# Patient Record
Sex: Female | Born: 1990 | Marital: Married | Smoking: Never smoker
Health system: Southern US, Community
[De-identification: ages and names within clinical notes are randomized; demographics above are authoritative.]

---

## 2014-09-05 ENCOUNTER — Emergency Department (HOSPITAL_COMMUNITY)
Admission: EM | Admit: 2014-09-05 | Discharge: 2014-09-05 | Disposition: A | Discharge status: Home / Self Care | Payer: Medicaid - Out of State | Attending: Emergency Medicine | Admitting: Emergency Medicine

## 2014-09-05 ENCOUNTER — Emergency Department (HOSPITAL_COMMUNITY): Payer: Medicaid - Out of State

## 2014-09-05 ENCOUNTER — Encounter (HOSPITAL_COMMUNITY): Payer: Self-pay | Admitting: *Deleted

## 2014-09-05 DIAGNOSIS — Y9289 Other specified places as the place of occurrence of the external cause: Secondary | ICD-10-CM | POA: Insufficient documentation

## 2014-09-05 DIAGNOSIS — X58XXXA Exposure to other specified factors, initial encounter: Secondary | ICD-10-CM | POA: Insufficient documentation

## 2014-09-05 DIAGNOSIS — Y939 Activity, unspecified: Secondary | ICD-10-CM | POA: Insufficient documentation

## 2014-09-05 DIAGNOSIS — T7840XA Allergy, unspecified, initial encounter: Secondary | ICD-10-CM | POA: Insufficient documentation

## 2014-09-05 DIAGNOSIS — Y998 Other external cause status: Secondary | ICD-10-CM | POA: Diagnosis not present

## 2014-09-05 DIAGNOSIS — R0602 Shortness of breath: Secondary | ICD-10-CM

## 2014-09-05 NOTE — ED Notes (Addendum)
Pt took Benadryl PTA.  All lung sounds are clear but pt reports her breathing feels labored.  Pt placed on 2L O2 for comfort.

## 2014-09-05 NOTE — ED Notes (Signed)
Pt ambulated down hallway and around nurses station.  Pt pulse Ox remained above 92%.  Pt denies any dizziness or SHOB.

## 2014-09-05 NOTE — Discharge Instructions (Signed)
Allergies °Allergies may happen from anything your body is sensitive to. This may be food, medicines, pollens, chemicals, and nearly anything around you in everyday life that produces allergens. An allergen is anything that causes an allergy producing substance. Heredity is often a factor in causing these problems. This means you may have some of the same allergies as your parents. °Food allergies happen in all age groups. Food allergies are some of the most severe and life threatening. Some common food allergies are cow's milk, seafood, eggs, nuts, wheat, and soybeans. °SYMPTOMS  °· Swelling around the mouth. °· An itchy red rash or hives. °· Vomiting or diarrhea. °· Difficulty breathing. °SEVERE ALLERGIC REACTIONS ARE LIFE-THREATENING. °This reaction is called anaphylaxis. It can cause the mouth and throat to swell and cause difficulty with breathing and swallowing. In severe reactions only a trace amount of food (for example, peanut oil in a salad) may cause death within seconds. °Seasonal allergies occur in all age groups. These are seasonal because they usually occur during the same season every year. They may be a reaction to molds, grass pollens, or tree pollens. Other causes of problems are house dust mite allergens, pet dander, and mold spores. The symptoms often consist of nasal congestion, a runny itchy nose associated with sneezing, and tearing itchy eyes. There is often an associated itching of the mouth and ears. The problems happen when you come in contact with pollens and other allergens. Allergens are the particles in the air that the body reacts to with an allergic reaction. This causes you to release allergic antibodies. Through a chain of events, these eventually cause you to release histamine into the blood stream. Although it is meant to be protective to the body, it is this release that causes your discomfort. This is why you were given anti-histamines to feel better.  If you are unable to  pinpoint the offending allergen, it may be determined by skin or blood testing. Allergies cannot be cured but can be controlled with medicine. °Hay fever is a collection of all or some of the seasonal allergy problems. It may often be treated with simple over-the-counter medicine such as diphenhydramine. Take medicine as directed. Do not drink alcohol or drive while taking this medicine. Check with your caregiver or package insert for child dosages. °If these medicines are not effective, there are many new medicines your caregiver can prescribe. Stronger medicine such as nasal spray, eye drops, and corticosteroids may be used if the first things you try do not work well. Other treatments such as immunotherapy or desensitizing injections can be used if all else fails. Follow up with your caregiver if problems continue. These seasonal allergies are usually not life threatening. They are generally more of a nuisance that can often be handled using medicine. °HOME CARE INSTRUCTIONS  °· If unsure what causes a reaction, keep a diary of foods eaten and symptoms that follow. Avoid foods that cause reactions. °· If hives or rash are present: °¨ Take medicine as directed. °¨ You may use an over-the-counter antihistamine (diphenhydramine) for hives and itching as needed. °¨ Apply cold compresses (cloths) to the skin or take baths in cool water. Avoid hot baths or showers. Heat will make a rash and itching worse. °· If you are severely allergic: °¨ Following a treatment for a severe reaction, hospitalization is often required for closer follow-up. °¨ Wear a medic-alert bracelet or necklace stating the allergy. °¨ You and your family must learn how to give adrenaline or use   an anaphylaxis kit.  If you have had a severe reaction, always carry your anaphylaxis kit or EpiPen with you. Use this medicine as directed by your caregiver if a severe reaction is occurring. Failure to do so could have a fatal outcome. SEEK MEDICAL  CARE IF:  You suspect a food allergy. Symptoms generally happen within 30 minutes of eating a food.  Your symptoms have not gone away within 2 days or are getting worse.  You develop new symptoms.  You want to retest yourself or your child with a food or drink you think causes an allergic reaction. Never do this if an anaphylactic reaction to that food or drink has happened before. Only do this under the care of a caregiver. SEEK IMMEDIATE MEDICAL CARE IF:   You have difficulty breathing, are wheezing, or have a tight feeling in your chest or throat.  You have a swollen mouth, or you have hives, swelling, or itching all over your body.  You have had a severe reaction that has responded to your anaphylaxis kit or an EpiPen. These reactions may return when the medicine has worn off. These reactions should be considered life threatening. MAKE SURE YOU:   Understand these instructions.  Will watch your condition.  Will get help right away if you are not doing well or get worse. Document Released: 11/09/2002 Document Revised: 12/11/2012 Document Reviewed: 04/15/2008 [facility] Patient Information 2015 [place], [place]. This information is not intended to replace advice given to you by your health care provider. Make sure you discuss any questions you have with your health care provider.   Emergency Department Resource Guide 1) Find a Doctor and Pay Out of Pocket Although you won't have to find out who is covered by your insurance plan, it is a good idea to ask around and get recommendations. You will then need to call the office and see if the doctor you have chosen will accept you as a new patient and what types of options they offer for patients who are self-pay. Some doctors offer discounts or will set up payment plans for their patients who do not have insurance, but you will need to ask so you aren't surprised when you get to your appointment.  2) Contact Your Local Health  Department Not all health departments have doctors that can see patients for sick visits, but many do, so it is worth a call to see if yours does. If you don't know where your local health department is, you can check in your phone book. The CDC also has a tool to help you locate your state's health department, and many state websites also have listings of all of their local health departments.  3) Find a [facility] If your illness is not likely to be very severe or complicated, you may want to try a walk in clinic. These are popping up all over the country in pharmacies, drugstores, and shopping centers. They're usually staffed by nurse practitioners or physician assistants that have been trained to treat common illnesses and complaints. They're usually fairly quick and inexpensive. However, if you have serious medical issues or chronic medical problems, these are probably not your best option.  No Primary Care Doctor: - Call Health Connect at  [phone] - they can help you locate a primary care doctor that  accepts your insurance, provides certain services, etc. - Physician Referral Service- [phone]  Chronic Pain Problems: Organization         Address  Phone  Notes  [facility]  [phone] Patients need to be referred by their primary care doctor.   Medication Assistance: Organization         Address  Phone   Notes  [facility] Medication [facility] [address] [place], [place] [address] [phone] --Must be a resident of [facility] -- Must have NO insurance coverage whatsoever (no Medicaid/ Medicare, etc.) -- The pt. MUST have a primary care doctor that directs their care regularly and follows them in the community   Med[facility]ist  [phone]   [employer]  [phone]    Agencies that provide inexpensive medical care: Organization         Address  Phone   Notes  [place]  [phone]   [facility]    [phone]   [facility] [place],  [address] [phone]   [place] [address], [place] [phone]   Planned Parenthood    [phone]   [facility]    [phone]   [place] and [place] [address], [place] Phone:  [phone], Fax:  [phone] Hours of Operation:  9 am - 6 pm, M-F.  Also accepts Medicaid/Medicare and self-pay.  [facility] for [place], [address], [place] Phone: [phone], Fax: [phone]. Hours of Operation:  8:30 am - 5:30 pm, M-F.  Also accepts Medicaid and self-pay.  [facility] [address], [place] Phone: [phone]   [place], [place], [place] [phone], Ext. [phone] Mondays & Thursdays: 7-9 AM.  First 15 patients are seen on a first come, first serve basis.    [place] Providers:  Organization         Address  Phone   Notes  [facility] [address], [place] [phone] Also accepts self-pay patients.  [facility] [address], [place]  [phone]   [place], [address], [place] [phone]   [facility] [address] [place] [phone]   Lucianne Lei [address], [place]   [phone] Only accepts [place] Access [place] patients after they have their name applied to their card.   Self-Pay (no insurance) in [facility]:  Organization         Address  Phone   Notes  Sickle Cell Patients, [facility] [place] [phone]   [facility] [place] [phone]   [facility] [place]  [place], [place],  [place] [phone]   [facility]/Dr. Osei-Bonsu  [address], [place] [phone] Phone number for both [place] and [place] locations is the same.  Urgent Medical and [facility] [address] [place] [phone]   [facility] [address] [place] or [address] [phone]   [facility] [address], [place] [phone], phone; [phone], fax Sees patients 1st and 3rd Saturday of every month.  Must not qualify for public or private insurance (  i.e. Medicaid, Medicare, [place] Health Choice, Veterans' Benefits)  Household income should be no more than 200% of the poverty level The clinic cannot treat you if you are pregnant or think you are pregnant  Sexually transmitted diseases are not treated at the clinic.    Dental Care: Organization         Address  Phone  Notes  [facility] Department of [facility] [place] [phone] Accepts children up to age 106 who are enrolled in [place] or [place]; pregnant women with a Medicaid card; and children who have applied for Medicaid or [place] Health Choice, but were declined, whose parents can pay a reduced fee at time of service.  [facility] Department of [facility]  [address], [place] [phone] Accepts children up to age 78 who are enrolled in [place] or [place]; pregnant women with a Medicaid card; and children who have applied for Medicaid or [place] Health Choice, but were declined, whose parents can pay a reduced fee at time of service.  [place] Adult Dental Access PROGRAM  [place] [phone] Patients are seen by appointment only. Walk-ins are not accepted. [place] will see patients 31 years of age and older. Monday - Tuesday (8am-5pm) Most Wednesdays  (8:30-5pm) $30 per visit, cash only  [facility] Adult Dental Access PROGRAM  [address], [facility] [phone] Patients are seen by appointment only. Walk-ins are not accepted. [place] will see patients 82 years of age and older. One Wednesday Evening (Monthly: Volunteer Based).  $30 per visit, cash only  [place]  [phone] for adults; Children under age 29, call Graduate Pediatric Dentistry at [phone]. Children aged 70-14, please call [phone] to request a pediatric application.  Dental services are provided in all areas of dental care including fillings, crowns and bridges, complete and partial dentures, implants, gum treatment, root canals, and extractions. Preventive care is also provided. Treatment is provided to both adults and children. Patients are selected via a lottery and there is often a waiting list.   [facility] [address], [place]  [phone] [email or web address]   [facility] [address] [place], [place] [phone], Ext. [phone] Second and Fourth Thursday of each month, opens at 6:30 AM; Clinic ends at 9 AM.  Patients are seen on a first-come first-served basis, and a limited number are seen during each clinic.   [facility]  [address] Hillard Danker [place], [place] [phone]   Eligibility Requirements You must have lived in [place], [place], or [place] counties for at least the last three months.   You cannot be eligible for state or federal sponsored [employer], including [employer], [place], or [employer].   You generally cannot be eligible for healthcare insurance through your employer.    How to apply: Eligibility screenings are held every Tuesday and Wednesday afternoon from 1:00 pm until 4:00 pm. You do not need an appointment for the interview!  [facility] [address], [place], [place]   [place]  [place] Department  [place]  [phone]    Behavioral Health Resources in the Community: Intensive Outpatient Programs Organization         Address  Phone  Notes  [facility]  [place] [address] [place], [place] [phone]   [facility] Outpatient [address] [place], [place]   ADS: Alcohol & Drug Svcs [address] [place], [place]   [place] [address],  [place], [place] or [phone]   Substance Abuse Resources Organization         Address  Phone  Notes  Alcohol and Drug Services  [phone]   [place]  [phone]   [facility] [place]   Chinita Pester  [phone]   Residential & Outpatient Substance Abuse Program  [phone]   Psychological Services Organization         Address  Phone  Notes  [facility]  [place]  [phone]   [place] [address] [place] or [phone]    Mobile Crisis Teams Organization         Address  Phone  Notes  [facility], Mobile Crisis Care Unit  [phone]   [facility]  [address] [place], [place]   [place] [address], [place] [facility] [place] [phone]    Self-Help/Support Groups Organization         Address  Phone             Notes  [place]. of [place] - variety of support groups  [place] Call for more information  Narcotics Anonymous (NA), [facility] [address], [employer] [place]  2 meetings at this location   Special educational needs teacher         Address  Phone  Notes  [facility]idential Treatment [place],    [place]  [phone]   [facility]  [address] [place] [address], [place], [place]   [facility] [place], [place] [phone] Admissions: 8am-3pm M-F  Incentives Substance [place] [address],    [place], [place] [phone]   [facility] [address] [place], [place], [place]   [facility] [address],  [place], [place]   [facility] - Intensive Outpatient [address] Kristeen [address], [place], [place]   [facility] ([place] [address]  [place], [place] [phone] or [phone]   [facility] Services (RTS) [address] [place], [place] Accepts Medicaid  [facility] [address],  [place] [phone] Substance Abuse/Addiction Treatment   [facility] Organization         Address  Phone  Notes  [facility]  [phone]   Domenic Schwab, PhD [address] Arlis [place], [place]   [phone] or [phone]   [place], [place] [phone]   [facility] [address], [place], [place] [phone] Insurance/Medicaid/sponsorship through [facility] and Families [address] [place]                                    [place], [place] [phone] [place] [address], [place] [phone]    Dr. Adele Schilder  [phone]   [facility] [place] [facility] Dept. 1)  [address],  2) [place] 3)  [place], [place] [phone]  [phone]   [place] [phone] or [phone] (After Hours)

## 2014-09-05 NOTE — ED Provider Notes (Signed)
CSN: [id number]     Arrival date & time 09/05/14  1622 History   First MD Initiated Contact with Patient 09/05/14 1719     Chief Complaint  Patient presents with  . Allergic Reaction     (Consider location/radiation/quality/duration/timing/severity/associated sxs/prior Treatment) HPI   24 year old female who presents for evaluation of an allergic reaction. Patient reports she developed shortness of breath, and her hands and chest at approximately an hour ago while she was unpacking in her new apartment. States that she noticed hives throughout her hands and chest area was itchy. She also reported having shortness of breath during that episode. She did take Benadryl prior to arrival but decided to come to ER because the symptom was not resolving as fast as she anticipated. Patient states she has 2 separate episodes in the past with similar hives and shortness of breath and related to having been bitten by insects. She also reported having a non-itchy rash that comes and goes for the past month. Rash was noted to her arms, chest. She was seen by her PCP with concerns of possible scabies because her roommate has similar rash however it was felt it was not likely to be related to scabies. She was prescribed hydrocortisone cream to use as needed. She was also given a referral to dermatologist which she has not follow up. She denies having any change in her medication, new pets, new soap detergent or other body products. Denies any prior history of PE DVT, recent surgery, prolonged rest, unilateral leg swelling or calf pain, active cancer, or taking birth control pill. She did report being on a flight for approximately 2-3 hours several weeks ago and did report some right leg cramping intermittently at that time but no swelling. At this time she felt that her symptom has resolved, no complaint of throat swelling, lip swelling, abdominal cramping nausea vomiting diarrhea.  No past medical history on file. No  past surgical history on file. No family history on file. History  Substance Use Topics  . Smoking status: Never Smoker   . Smokeless tobacco: Never Used  . Alcohol Use: No   OB History    No data available     Review of Systems  All other systems reviewed and are negative.     Allergies  Review of patient's allergies indicates no known allergies.  Home Medications   Prior to Admission medications   Not on File   BP 112/69 mmHg  Pulse 88  Temp(Src) 98.3 F (36.8 C) (Oral)  Resp 20  Ht  (1.702 m)  Wt 240 lb (108.863 kg)  BMI 37.58 kg/m2  SpO2 100%  LMP 08/25/2014 (Exact Date) Physical Exam  Constitutional: She is oriented to person, place, and time. She appears well-developed and well-nourished. No distress.  HENT:  Head: Atraumatic.  Mouth/Throat: Oropharynx is clear and moist.  No oral mucosa rash. No angioedema of lips.  Eyes: Conjunctivae are normal.  Neck: Neck supple.  Cardiovascular: Normal rate and regular rhythm.   Pulmonary/Chest: Effort normal and breath sounds normal. No respiratory distress. She has no wheezes.  Abdominal: Soft. There is no tenderness.  Neurological: She is alert and oriented to person, place, and time.  Skin: No rash noted.  Psychiatric: She has a normal mood and affect.  Nursing note and vitals reviewed.   ED Course  Procedures (including critical care time)  6:04 PM Patient here for evaluation of a possible allergic reaction to an unknown substance. She was recently moved  to appointment and currently unpacking. At this time she has no evidence of pruritic eyes, or respiratory compromise. She is mentating appropriately. She is technically PERC negative and I have low suspicion for PE. I do not appreciate any rash on exam. She mentioned that she has noticed swelling to her bilateral eyelids daily for the past several months. On exam, minimal edema noted to bilateral upper eyelids. She does not have evidence of peripheral  edema on exam. No signs of infection. Patient will likely benefit from a PCP.    7:15 PM Pt has been monitored without any worsening of sxs.  She's stable for discharge.  Resources provided   Labs Review Labs Reviewed - No data to display  Imaging Review Dg Chest 2 View  09/05/2014   CLINICAL DATA:  Shortness of breath and chest pressure.  EXAM: CHEST - 2 VIEW  COMPARISON:  None  FINDINGS: The heart size and mediastinal contours are within normal limits. Mild bibasilar atelectasis. There is no evidence of pulmonary edema, consolidation, pneumothorax, nodule or pleural fluid. The visualized skeletal structures are unremarkable.  IMPRESSION: No active disease.   Electronically Signed   By: Irish LackGlenn  Yamagata M.D.   On: 09/05/2014 17:00     EKG Interpretation None      MDM   Final diagnoses:  Allergic reaction, initial encounter    BP 116/68 mmHg  Pulse 80  Temp(Src) 98.3 F (36.8 C) (Oral)  Resp 20  Ht 5\' 7"  (1.702 m)  Wt 240 lb (108.863 kg)  BMI 37.58 kg/m2  SpO2 100%  LMP 08/25/2014 (Exact Date)     Fayrene HelperBowie Burdette Forehand, PA-C 09/05/14 1916  Tilden FossaElizabeth Rees, MD 09/06/14 [id number]

## 2014-09-05 NOTE — ED Notes (Addendum)
Pt reports having a allergic reaction to an unknown substance. Pt reports symptoms started 45 minutes ago. Pt states she moved into a new apartment today and was unpacking when symptoms started. Pt took a benadryl when symptoms started. Pt reports difficullty breathing. Pt speaking in full complete sentences, no respiratory distress noted with airway intact. Pt respirations 20 bpm, 100% RA. Pt reports hives on hands and chest. No hives noted. Pt denies taking any new medications, change in soaps, lotions, no new medications.

## 2017-01-04 IMAGING — CR DG CHEST 2V
2 series · 2 of 2 positions shown · non-contrast
Comparison: None

CLINICAL DATA: Shortness of breath and chest pressure.

EXAM:
CHEST - 2 VIEW

[chest pa]
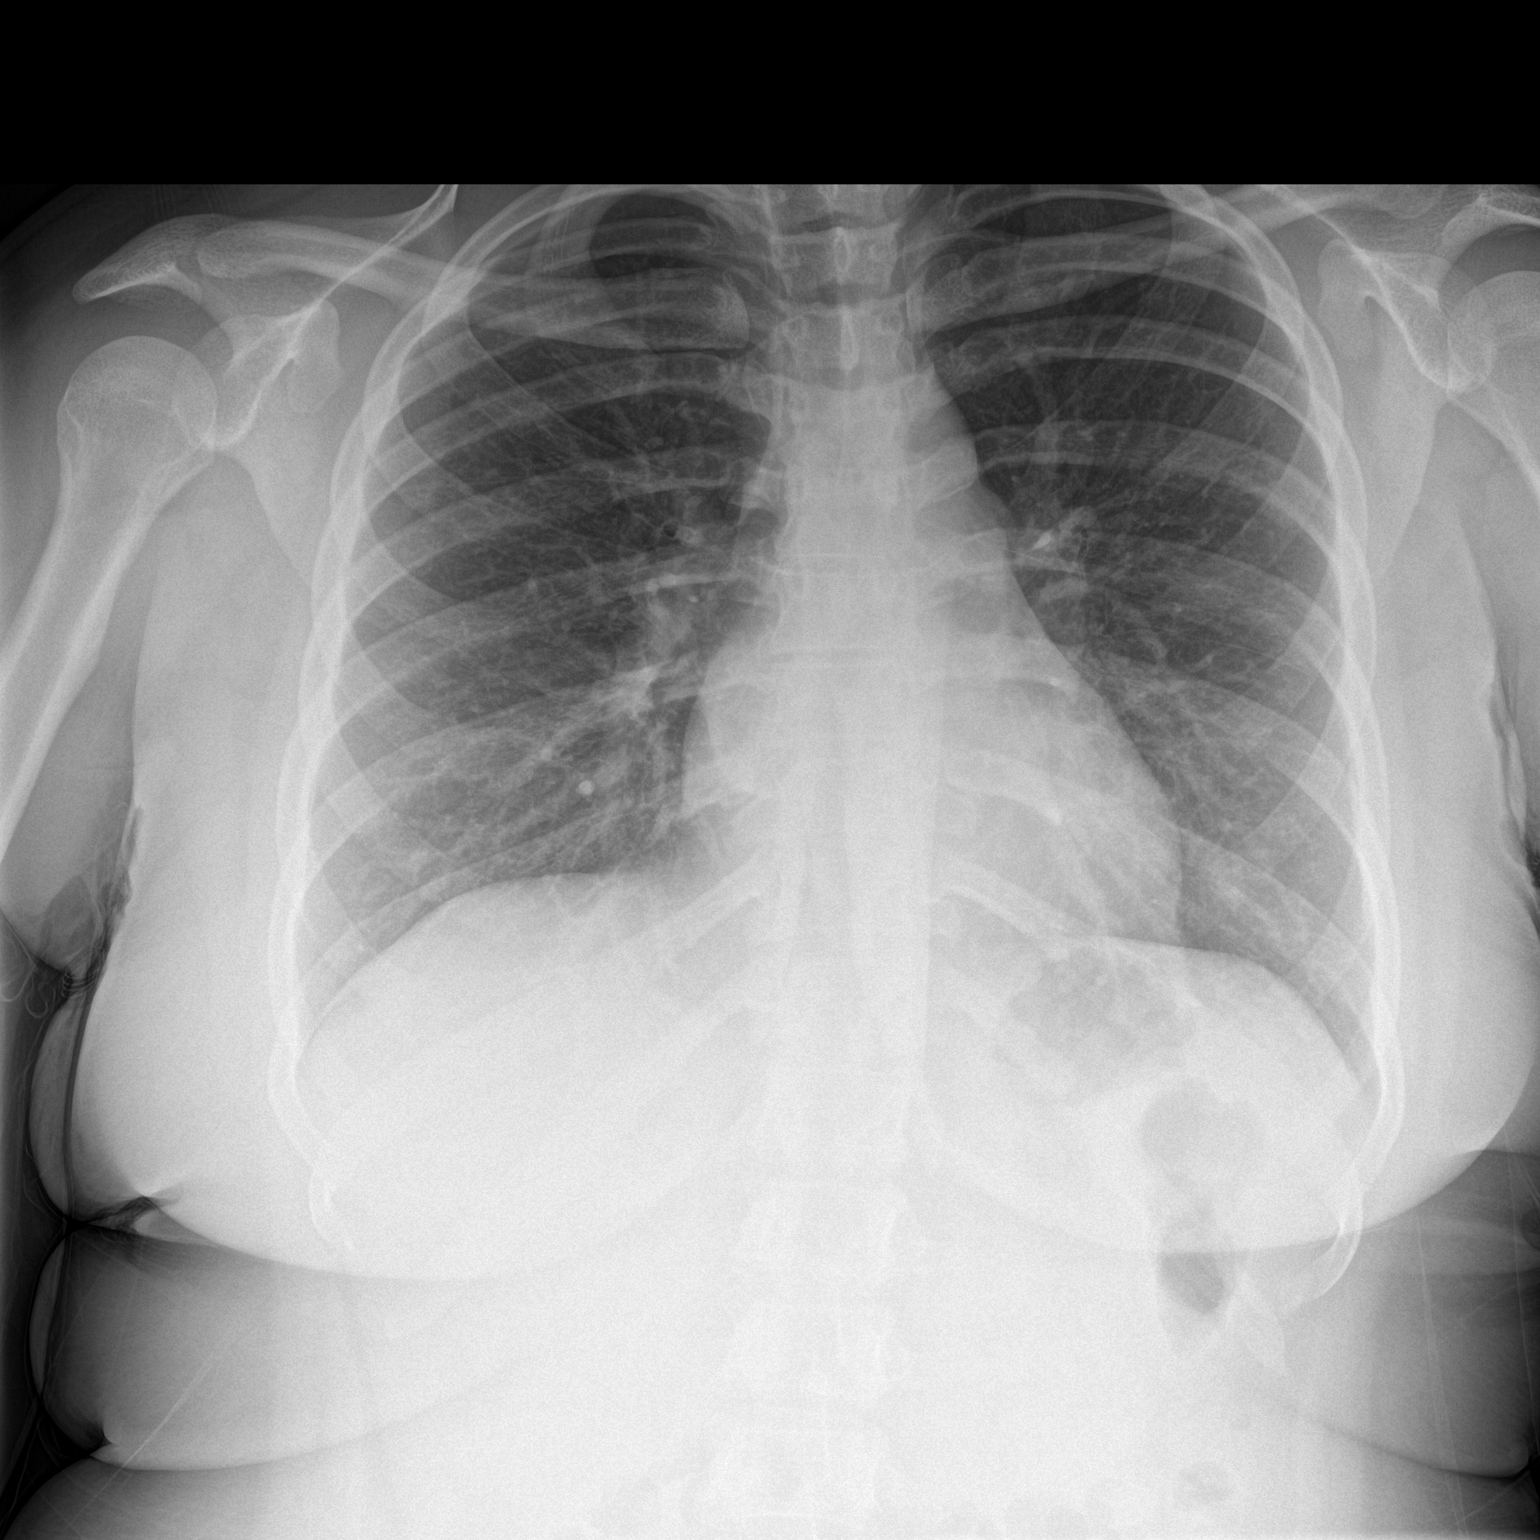

[chest lat]
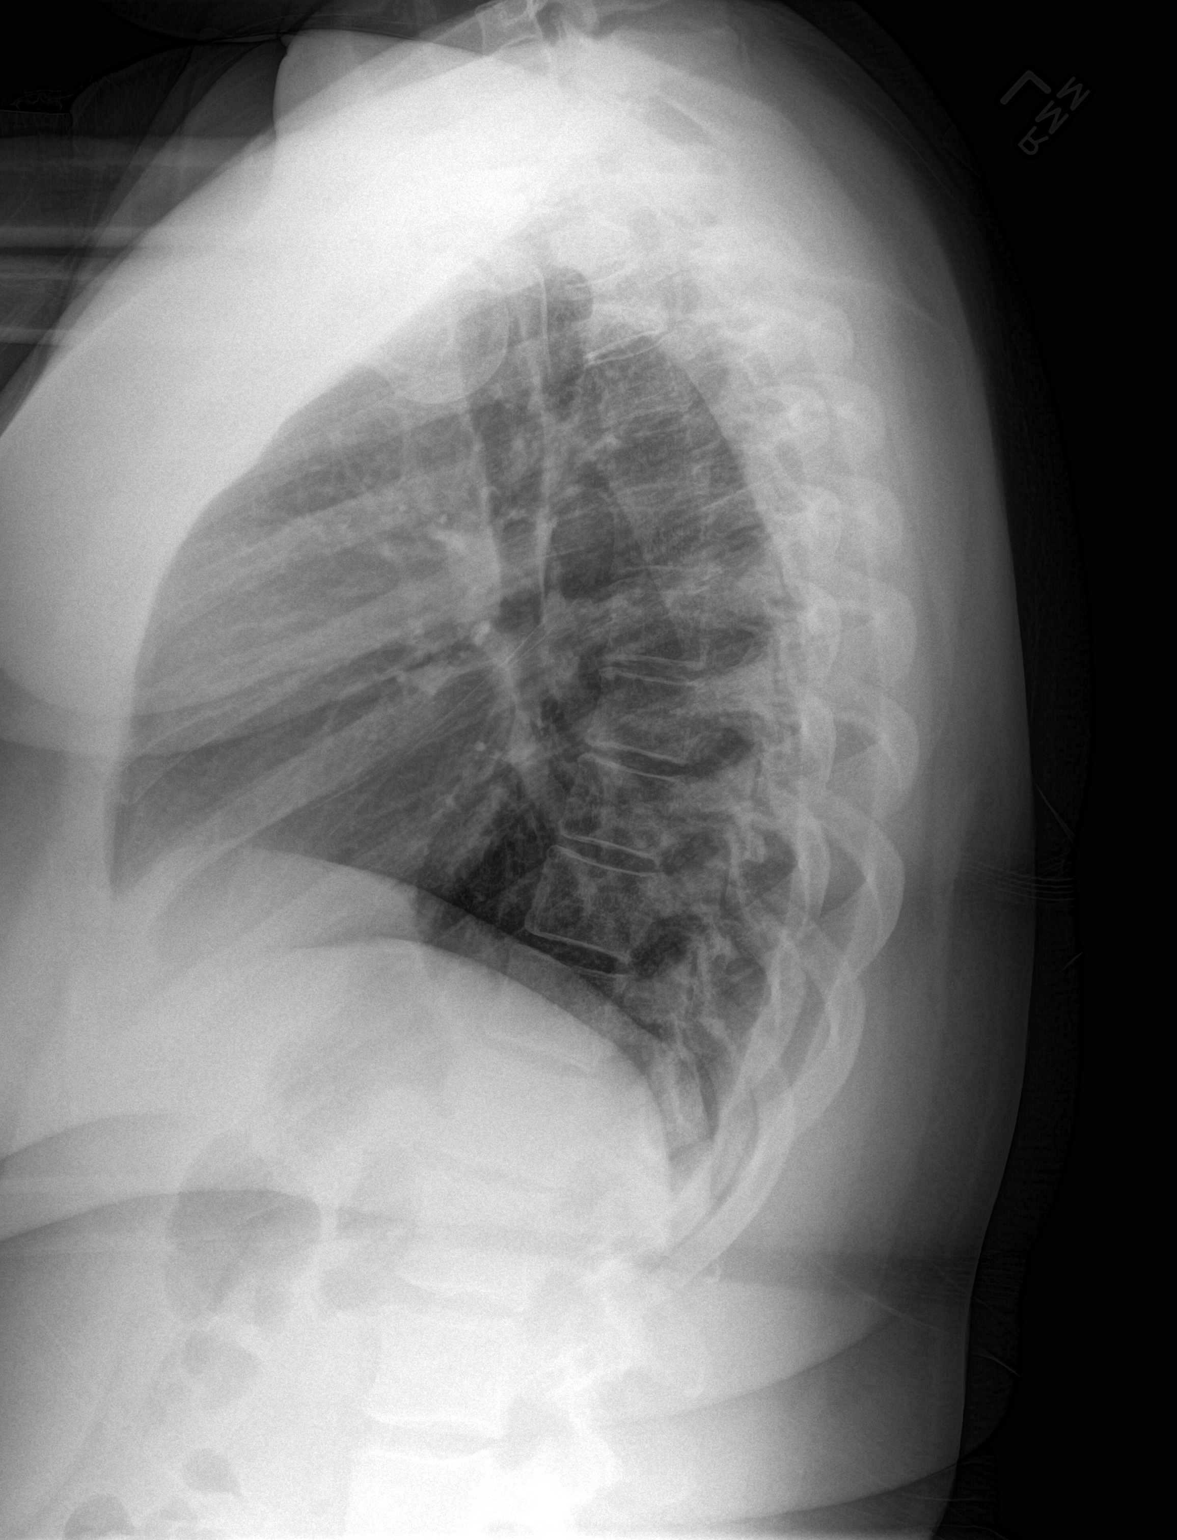

[2 of 2 positions shown; findings below may reference images not displayed]

FINDINGS: The heart size and mediastinal contours are within normal limits.
Mild bibasilar atelectasis. There is no evidence of pulmonary edema,
consolidation, pneumothorax, nodule or pleural fluid. The visualized
skeletal structures are unremarkable.
IMPRESSION: No active disease.

## 2019-01-12 ENCOUNTER — Ambulatory Visit (FREE_STANDING_LABORATORY_FACILITY): Payer: Commercial Managed Care - PPO

## 2019-01-12 DIAGNOSIS — Z01818 Encounter for other preprocedural examination: Secondary | ICD-10-CM

## 2019-01-15 ENCOUNTER — Encounter: Payer: Self-pay | Admitting: Obstetrics & Gynecology

## 2019-01-15 ENCOUNTER — Telehealth: Payer: Commercial Managed Care - PPO

## 2019-01-15 NOTE — Pre-Procedure Instructions (Signed)
Patient Access Screening Script For Potential COVID-19 Persons Under Investigation (PUI)  QUESTIONS RESPONSE    (TYPE YES OR NO)   Are you currently experiencing fever, and/or symptoms of acute respiratory illness (such as cough, difficulty breathing)?  NO   Do you have a history of travel from affected geographic areas [email or web address]  within 14 days of symptom onset?    NO    Did you have close contact with a laboratory-confirmed COVID-19 person?    NO    Do you reside in a nursing home or long-term care facility or assisted living facility? NO      If yes to any of the questions,   Assure the caller that just because they answered yes does not mean they have COVID-19, and they should meet with their primary care provider to assess their signs and symptoms and to notify their surgeon's office.     Please advise patient,  "If your current situation changes between now and the night before surgery, please call your surgeon."  "Thank you for adapting to the ever changing scenario!"     SPECIAL INSTRUCTIONS FOR THE PSS NAVIGATORS:  CONTACT ISOLATION:     TRANSLATOR REQUESTED:    SPECIAL INSTRUCTIONS:    TRANSPORTATION:  HUSBAND WILL COME IN WITH HER AND WAIT OUTSIDE DURING SURGERY      TESTING - DAY OF SURGERY:  FSBS FOR BMI 40.4  PREGNANCY TEST WAIVERS:    ABNORMAL LABS:        SPECIAL CONSIDERATIONS-BLOOD PRODUCTS:    SPECIAL NEEDS FOR PATIENT - COVID DONE [facility] 01/12/19       PLEASE DO NOT REPLY TO THIS EMAIL. IF YOU HAVE ANY QUESTIONS, PLEASE CONTACT THE PREOP INTERVIEW OFFICE AT [phone] OR [phone]) [phone]   MONDAY - FRIDAY.    COVID19 PRE-SURGERY TESTING GUIDELINES    Out of an abundance of caution and for your safety, all patients scheduled for surgery at [facility] will be required to complete COVID19 testing 72 hours prior to their surgery date.     If your surgeon and/or primary care physician is affiliated with [facility] ([facility]),  you must contact the [facility] COVID 19 Hotline number at [phone] to schedule your testing appointment.  The [facility] COVID19 Hotline team will provide you with details concerning the COVID19 testing site and any subsequent steps.      If your surgeon and/or primary care physician is not affiliated with [facility] ([facility]), you will be scheduled to complete your COVID19 testing at [facility]. The [facility] Lab can be accessed through the [facility] of the hospital. You are instructed to park in front of the [address] (on [address]) or park in the [employer].  The concierge will direct you to the lab upon your arrival. Once your COVID19 testing order has been placed, you may complete your testing at the Outpatient lab anytime between the hours of 8:00 am - 5:00pm.  Patients are tested on a first-come, first-serve basis (there are no scheduled appointments). We suggest that you attempt to complete your testing between 2:00 pm - 4:00pm, when the lab is least busy.       It is extremely important that you self-quarantine once you have completed your testing until your surgery date.    Your surgeon's office or someone from the hospital will contact you with your test results.     Date of Procedure: 01/17/19    Arrival  Time: 7:00 AM    Procedure Time: 8:30 AM     Someone from the hospital will call you after 4pm the business day prior to your scheduled procedure to verify the arrival and procedure times listed above. The purpose of this call is to advise you of any scheduling changes that may have occurred since the time of your telephone interview with the pre surgical services nurse.    NOTE FROM THE ANESTHESIA DEPARTMENT:  Please note that your procedure may be cancelled or rescheduled if you do not complete the required testing, preop clearances and/or follow the medication instructions that are required by your surgeon and /or the anesthesia  department.    Requirements marked with an "X" below Must be completed prior to your procedure     (X ) Eating: You should not have anything to eat after  12:30 AM 01/17/19..    ( X ) Drinking: Drink clear liquids until  4:30 AM 01/17/19 .    CLEAR LIQUIDS INCLUDE: WATER, BLACK COFFEE/TEA (NO MILK, CREAM, NON-DAIRY CREAMER OR SUGAR), CARBONATED BEVERAGES, JUICES WITHOUT PULP (APPLE, CRANBERRY, GRAPE) AND GATORADE.    Medication Instructions:  MULTIVITAMIN   STOP 01/15/19.    STOP HERBAL AND GREEN TEA NOW.    DOES NOT  NOT TAKE IBUPROFEN, MOTRIN, ADVIL, ALEVE, NSAIDS.    YOU MAY TAKE TYLENOL AS NEEDED.    Hospital Address and Arrival Instructions:   [address] [place], [place] [address]  The [facility] is where you will enter.  A complimentary valet is available at the front [place] entrance of the hospital.  Please call [phone] morning of surgery if you need assistance upon arrival.    Upon arrival in the hospital main lobby via [employer], you will proceed to:  - Patient Registration, which is all the way down the hall on the left.  - Once registration is completed, you will be escorted by the registration staff to the Surgical Services Waiting Area, where you wait until a PreOp clinical team member escorts you to a room and initiates the PreOp process.  - You will need to have a valid photo I.D., insurance card and co-pay form of payment if required by insurance.    Pre Surgical General Instructions:  1. Bathe or shower the morning of the procedure with an anti-bacterial soap before arriving (unless instructed to use Chlorhexidine (CHG) or Hibiclens).   2. Do not apply lotion, perfume, cologne, or hair-care products such as hair spray or gels.  3. Do not shave your surgical site at home.  4. Do not wear makeup, jewelry including body piercing, watches, earrings, or rings.   5. You may brush your teeth and gargle on the morning of surgery but do not swallow any water.  6. Wear casual,  loose fitting and comfortable clothing. A gown will be provided.  7. Plan to leave unnecessary valuables, credit cards (except for co-pay payment use) and jewelry at home or with a companion on day of surgery for safe keeping. The hospital is not responsible for lost/stolen items.  8. If you wear contacts please leave them at home. If you wear glasses, please bring a case.  9. Dentures - we will provide a container  10. Hearing aids, you may bring dos but you will be asked to remove them before surgery.  11. Please arrange for someone to drive you home. For your safety you will not be allowed to drive home after sedation or anesthesia. A  responsible adult must be present to accompany you home when you are ready to leave. We strongly recommend that all patients have an adult at home with them for the first 24 hours after surgery.  12. Notify your doctor if you develop any sign of illness before the date of your surgery. Report symptoms such as: high fever, sore throat, or other infection, breathing difficulties or chest pain.  13. Discontinue herbal supplements and herbal/green tea one week prior to surgery.    Below is a link/web address to the Preparing for Your Procedure video that walks you through the surgical experience.   [email or web address]    Possible Anesthesia Side Effects:   Nausea and vomiting. This common side effect usually occurs immediately after the procedure, but some people may continue to feel sick for a day or two. Anti-nausea medicines can help.   Dry mouth. You may feel parched when you wake up. As long as you're not too nauseated, sipping water can help take care of your dry mouth.   Sore throat or hoarseness. The tube put in your throat to help you breathe during surgery can leave you with a sore throat after it's removed.   Chills and shivering. It's common for your body temperature to drop during general anesthesia. Your doctors and nurses will make sure your  temperature doesn't fall too much during surgery, but you may wake up shivering and feeling cold. Your chills may last for a few minutes to hours.   Confusion and fuzzy thinking. When first waking from anesthesia, you may feel confused, drowsy, and foggy. This usually lasts for just a few hours, but for some people -- especially older adults -- confusion can last for days or weeks.   Muscle aches. The drugs used to relax your muscles during surgery can cause soreness afterward.   Itching. If narcotic (opioid) medications are used during or after your operation, you may be itchy. This is a common side effect of this class of drugs.   Bladder problems. You may have difficulty passing urine for a short time after general anesthesia.   Dizziness. You may feel dizzy when you first stand up. Drinking plenty of fluids should help you feel better.

## 2019-01-15 NOTE — Anesthesia Preprocedure Evaluation (Addendum)
Anesthesia Evaluation    AIRWAY    Mallampati: II    TM distance: >3 FB  Neck ROM: full  Mouth Opening:full   CARDIOVASCULAR    cardiovascular exam normal       DENTAL         PULMONARY    pulmonary exam normal     OTHER FINDINGS                  Relevant Problems   No relevant active problems     BMI 40    No significant hx          Anesthesia Plan    ASA 2     general                             Post op pain management: per surgeon    informed consent obtained    Plan discussed with CRNA.                   Signed by: Oris Drone 01/15/19 3:14 PM

## 2019-01-16 ENCOUNTER — Encounter: Payer: Self-pay | Admitting: Obstetrics & Gynecology

## 2019-01-17 ENCOUNTER — Ambulatory Visit
Admission: RE | Admit: 2019-01-17 | Discharge: 2019-01-17 | Disposition: A | Discharge status: Home / Self Care | Payer: Commercial Managed Care - PPO | Source: Ambulatory Visit | Attending: Obstetrics & Gynecology | Admitting: Obstetrics & Gynecology

## 2019-01-17 ENCOUNTER — Ambulatory Visit: Payer: Commercial Managed Care - PPO | Admitting: Certified Registered"

## 2019-01-17 ENCOUNTER — Ambulatory Visit: Payer: Self-pay

## 2019-01-17 ENCOUNTER — Encounter
Admission: RE | Disposition: A | Discharge status: Home / Self Care | Payer: Self-pay | Source: Ambulatory Visit | Attending: Obstetrics & Gynecology

## 2019-01-17 DIAGNOSIS — O021 Missed abortion: Secondary | ICD-10-CM | POA: Insufficient documentation

## 2019-01-17 DIAGNOSIS — Z03818 Encounter for observation for suspected exposure to other biological agents ruled out: Secondary | ICD-10-CM | POA: Insufficient documentation

## 2019-01-17 LAB — COVID-19 (SARS-COV-2): SARS CoV 2 Overall Result: NEGATIVE

## 2019-01-17 SURGERY — DILATION AND CURETTAGE (D&C), SUCTION
Anesthesia: Anesthesia General | Site: Pelvis | Wound class: Clean Contaminated

## 2019-01-17 MED ORDER — PROMETHAZINE HCL 25 MG/ML IJ SOLN
6.2500 mg | Freq: Once | INTRAMUSCULAR | Status: DC | PRN
Start: 2019-01-17 — End: 2019-01-17

## 2019-01-17 MED ORDER — ONDANSETRON HCL 4 MG/2ML IJ SOLN
INTRAMUSCULAR | Status: AC
Start: 2019-01-17 — End: ?
  Filled 2019-01-17: qty 2

## 2019-01-17 MED ORDER — HYDROMORPHONE HCL 1 MG/ML IJ SOLN
0.5000 mg | INTRAMUSCULAR | Status: DC | PRN
Start: 2019-01-17 — End: 2019-01-17
  Administered 2019-01-17: 0.5 mg via INTRAVENOUS
  Filled 2019-01-17: qty 1

## 2019-01-17 MED ORDER — LACTATED RINGERS IV SOLN
INTRAVENOUS | Status: DC
Start: 2019-01-17 — End: 2019-01-17
  Administered 2019-01-17: 08:00:00 1000 mL via INTRAVENOUS

## 2019-01-17 MED ORDER — ONDANSETRON HCL 4 MG/2ML IJ SOLN
INTRAMUSCULAR | Status: DC | PRN
Start: 2019-01-17 — End: 2019-01-17
  Administered 2019-01-17: 4 mg via INTRAVENOUS

## 2019-01-17 MED ORDER — LIDOCAINE HCL (PF) 2 % IJ SOLN
INTRAMUSCULAR | Status: DC | PRN
Start: 2019-01-17 — End: 2019-01-17
  Administered 2019-01-17: 100 mg via INTRAVENOUS

## 2019-01-17 MED ORDER — LIDOCAINE HCL (PF) 2 % IJ SOLN
INTRAMUSCULAR | Status: AC
Start: 2019-01-17 — End: ?
  Filled 2019-01-17: qty 5

## 2019-01-17 MED ORDER — HYDROCODONE-ACETAMINOPHEN 5-325 MG PO TABS
1.0000 | ORAL_TABLET | Freq: Once | ORAL | Status: DC | PRN
Start: 2019-01-17 — End: 2019-01-17

## 2019-01-17 MED ORDER — CEFAZOLIN SODIUM 1 G IJ SOLR
2.0000 g | Freq: Once | INTRAMUSCULAR | Status: AC
Start: 2019-01-17 — End: 2019-01-17
  Administered 2019-01-17: 09:00:00 2 g via INTRAVENOUS
  Filled 2019-01-17 (×2): qty 1000

## 2019-01-17 MED ORDER — PROPOFOL 10 MG/ML IV EMUL (WRAP)
INTRAVENOUS | Status: AC
Start: 2019-01-17 — End: ?
  Filled 2019-01-17: qty 20

## 2019-01-17 MED ORDER — LIDOCAINE HCL (PF) 1 % IJ SOLN
INTRAMUSCULAR | Status: AC
Start: 2019-01-17 — End: ?
  Filled 2019-01-17: qty 30

## 2019-01-17 MED ORDER — SILVER NITRATE-POT NITRATE 75-25 % EX MISC
CUTANEOUS | Status: AC
Start: 2019-01-17 — End: ?
  Filled 2019-01-17: qty 1

## 2019-01-17 MED ORDER — FENTANYL CITRATE (PF) 50 MCG/ML IJ SOLN (WRAP)
INTRAMUSCULAR | Status: AC
Start: 2019-01-17 — End: ?
  Filled 2019-01-17: qty 2

## 2019-01-17 MED ORDER — HYDRALAZINE HCL 20 MG/ML IJ SOLN
10.0000 mg | Freq: Once | INTRAMUSCULAR | Status: DC
Start: 2019-01-17 — End: 2019-01-17

## 2019-01-17 MED ORDER — HYDROMORPHONE HCL 1 MG/ML IJ SOLN
0.5000 mg | INTRAMUSCULAR | Status: DC | PRN
Start: 2019-01-17 — End: 2019-01-17

## 2019-01-17 MED ORDER — ACETAMINOPHEN 500 MG PO TABS
1000.0000 mg | ORAL_TABLET | Freq: Once | ORAL | Status: AC
Start: 2019-01-17 — End: 2019-01-17

## 2019-01-17 MED ORDER — AMMONIA AROMATIC IN INHA
1.0000 | Freq: Once | RESPIRATORY_TRACT | Status: DC | PRN
Start: 2019-01-17 — End: 2019-01-17

## 2019-01-17 MED ORDER — LIDOCAINE 1% BUFFERED - CNR/OUTSOURCED
0.3000 mL | Freq: Once | INTRAMUSCULAR | Status: AC
Start: 2019-01-17 — End: 2019-01-17
  Administered 2019-01-17: 0.3 mL via INTRADERMAL
  Filled 2019-01-17: qty 1

## 2019-01-17 MED ORDER — PROPOFOL INFUSION 10 MG/ML
INTRAVENOUS | Status: DC | PRN
Start: 2019-01-17 — End: 2019-01-17
  Administered 2019-01-17: 200 mg via INTRAVENOUS

## 2019-01-17 MED ORDER — LABETALOL HCL 5 MG/ML IV SOLN (WRAP)
20.0000 mg | Freq: Once | INTRAVENOUS | Status: DC
Start: 2019-01-17 — End: 2019-01-17

## 2019-01-17 MED ORDER — DEXAMETHASONE SODIUM PHOSPHATE 4 MG/ML IJ SOLN (WRAP)
INTRAMUSCULAR | Status: DC | PRN
Start: 2019-01-17 — End: 2019-01-17
  Administered 2019-01-17: 4 mg via INTRAVENOUS

## 2019-01-17 MED ORDER — DEXAMETHASONE SODIUM PHOSPHATE 4 MG/ML IJ SOLN
INTRAMUSCULAR | Status: AC
Start: 2019-01-17 — End: ?
  Filled 2019-01-17: qty 1

## 2019-01-17 MED ORDER — KETAMINE HCL 100 MG/ML IJ SOLN
INTRAMUSCULAR | Status: DC | PRN
Start: 2019-01-17 — End: 2019-01-17
  Administered 2019-01-17: 25 mg via INTRAVENOUS

## 2019-01-17 MED ORDER — KETAMINE HCL 100 MG/ML IJ SOLN
INTRAMUSCULAR | Status: AC
Start: 2019-01-17 — End: ?
  Filled 2019-01-17: qty 0.25

## 2019-01-17 MED ORDER — MEPERIDINE HCL 25 MG/ML IJ SOLN
12.5000 mg | INTRAMUSCULAR | Status: DC | PRN
Start: 2019-01-17 — End: 2019-01-17

## 2019-01-17 MED ORDER — MIDAZOLAM HCL 1 MG/ML IJ SOLN (WRAP)
INTRAMUSCULAR | Status: DC | PRN
Start: 2019-01-17 — End: 2019-01-17
  Administered 2019-01-17: 2 mg via INTRAVENOUS

## 2019-01-17 MED ORDER — FENTANYL CITRATE (PF) 50 MCG/ML IJ SOLN (WRAP)
INTRAMUSCULAR | Status: DC | PRN
Start: 2019-01-17 — End: 2019-01-17
  Administered 2019-01-17 (×2): 12.5 ug via INTRAVENOUS
  Administered 2019-01-17: 25 ug via INTRAVENOUS

## 2019-01-17 MED ORDER — ACETAMINOPHEN 500 MG PO TABS
ORAL_TABLET | ORAL | Status: AC
Start: 2019-01-17 — End: 2019-01-17
  Administered 2019-01-17: 08:00:00 1000 mg via ORAL
  Filled 2019-01-17: qty 2

## 2019-01-17 MED ORDER — ACETAMINOPHEN 500 MG PO TABS
500.0000 mg | ORAL_TABLET | Freq: Once | ORAL | Status: DC | PRN
Start: 2019-01-17 — End: 2019-01-17

## 2019-01-17 MED ORDER — ONDANSETRON HCL 4 MG/2ML IJ SOLN
4.0000 mg | Freq: Once | INTRAMUSCULAR | Status: DC | PRN
Start: 2019-01-17 — End: 2019-01-17

## 2019-01-17 MED ORDER — LIDOCAINE HCL (PF) 1 % IJ SOLN
INTRAMUSCULAR | Status: DC | PRN
Start: 2019-01-17 — End: 2019-01-17
  Administered 2019-01-17: 10 mL

## 2019-01-17 MED ORDER — HYDROMORPHONE HCL 2 MG PO TABS
2.0000 mg | ORAL_TABLET | Freq: Once | ORAL | Status: DC | PRN
Start: 2019-01-17 — End: 2019-01-17

## 2019-01-17 MED ORDER — MIDAZOLAM HCL 1 MG/ML IJ SOLN (WRAP)
INTRAMUSCULAR | Status: AC
Start: 2019-01-17 — End: ?
  Filled 2019-01-17: qty 2

## 2019-01-17 SURGICAL SUPPLY — 36 items
CANISTER SCT BRKLY SAFETOUCH CLS CNTN (Suction) ×1
CANISTER SUCTION CLOSED CNTNMNT TISSUE TRP SEAL CAP BERKELEY SAFETOUCH (Suction) ×1 IMPLANT
CANISTER SUCTION CLOSED CONTAINMENT (Suction) ×1
CANNULA VAC ASP CRV BRKLY VCRT 16MM STRL (Procedure Accessories) IMPLANT
CANNULA VAC ASP CRV BRKLY VCRT 7MM STRL (Cannula)
CANNULA VAC ASP CRV BRKLY VCRT 8MM LF (Cannula)
CANNULA VAC ASP CRV BRKLY VCRT 9MM LF (Cannula)
CANNULA VAC ASP CRV VCRT 10MM LF NS (Cannula)
CANNULA VAC ASP CRV VCRT 12MM (Cannula)
CANNULA VACURETTE F-TIP 6MM (Cannula) ×1
CANNULA VACUUM ASPIRATION OD10 MM CURVE (Cannula)
CANNULA VACUUM ASPIRATION OD10 MM CURVE VACURETTE (Cannula) IMPLANT
CANNULA VACUUM ASPIRATION OD12 MM CURVE (Cannula)
CANNULA VACUUM ASPIRATION OD12 MM CURVE VACURETTE (Cannula) IMPLANT
CANNULA VACUUM ASPIRATION OD7 MM CURVE VACURETTE (Cannula) IMPLANT
CANNULA VACUUM ASPIRATION OD8 MM CURVE (Cannula)
CANNULA VACUUM ASPIRATION OD8 MM CURVE BERKELEY VACURETTE ROUND TIP (Cannula) IMPLANT
CANNULA VACUUM ASPIRATION OD9 MM CURVE (Cannula)
CANNULA VACUUM ASPIRATION OD9 MM CURVE BERKELEY VACURETTE ROUND TIP (Cannula) IMPLANT
CANNULAS BERKELEY VACURETTE CV (Suction) ×1 IMPLANT
CATH URETHRAL RED RUBBER 16F (Catheter Urine) IMPLANT
COUNTER 20 COUNT MAGNET DEVON BLACK SHARPS 1820 PLASTIC FOAM LARGE (Patient Supply) ×1 IMPLANT
COUNTER SHRP PLS FM LG DVN BOXLOCKS LF (Patient Supply) ×2
CURETTE VACUUM OD6 MM PLASTIC SEMIRIGID (Cannula) ×1
CURETTE VACUUM OD6 MM PLASTIC SEMIRIGID ROUND TIP UTERINE VACURETTE (Cannula) IMPLANT
GLOVE SRG 6 BGL SSNTV LTX STRL PF RGH (Glove) ×1
GLOVE SURGICAL 6 BIOGEL SUPER-SENSITIVE (Glove) ×1
GLOVE SURGICAL 6 BIOGEL SUPER-SENSITIVE POWDER FREE ROUGH BEAD CUFF (Glove) ×1 IMPLANT
GOWN SRG FBRC LG STD ROYALSILK LF STRL (Gown) ×1
GOWN SURGICAL LARGE STANDARD FABRIC (Gown) ×1
GOWN SURGICAL LARGE STANDARD FABRIC ROYALSILK LEVEL 3 NONREINFORCE SET (Gown) ×1 IMPLANT
PAD SANITARY L12.25 IN X W4.25 IN HEAVY ABSORBENT MOISTURE BARRIER (Dressing) IMPLANT
PAD SNTR SLK FLF CRTY 12.25X4.25IN LF NS (Dressing) ×2
SPONGE SRG VISTEC 4X4IN LF STRL 16 PLY (Sponge) ×1
SPONGE SURGICAL L4 IN X W4 IN 16 PLY (Sponge) ×1 IMPLANT
TUBING SCT PLS 3/8IN 6FT LF STRL SWVL (Tubing) ×2 IMPLANT

## 2019-01-17 NOTE — Op Note (Signed)
Preop:  Missed abortion at 6 weeks.    Postop:  Same    Procedure:  Suction D and C    Surgeon:  Gerilyn Pilgrim    Anesthesia:  GETA    Drains:  None    Complications:  None    Specimens:  Products of conception    Disposition:  PACU awake and alert    Condition:  Stable    Operative technique:    The patient was brought to the OR and transferred to the OR table.  After administration of anesthesia, she was placed in the dorsal lithotomy position.  She was then prepped with disinfectant solution and draped with sterile towels and paper drapes.  A speculum was placed and the cervix visualized.  The cervix was grasped at 12:00 with an Allis. A paracervical block was done. The cervix was then dilated to accomodate a #6 flexible suction curette.  The uterus was then evacuated of a moderate amount of products of conception.  Sharp curettage was performed to ensure no retained products and a final pass was made with the suction curette.  All instruments were then removed.  Hemostasis was good.  Instrument and sponge counts were correct and the patient tolerated the procedure well.

## 2019-01-17 NOTE — Discharge Instr - AVS First Page (Signed)
Reason for your Hospital Admission:  ***  D & C, SUCTION    Instructions for after your discharge:  ***           Dilatation and Curettage Post Operative Instructions      After your D&C  You can expect some cramping for a few hours after the D&C. This can be controlled with an over-the-counter pain reliever.  You may have some light bleeding for a few weeks. Use pads instead of tampons.  Take showers instead of baths for about a week. Ask your health care provider if you should avoid exercising or having sex for a period of time.  Remember to call your Surgeon to make and keep a follow up appointment     When to Call your Surgeon  if you have:  Heavy bleeding (more than 1 pad an hour)  A fever over 101F (38.33C)  Increasing abdominal pain, tenderness, or cramping  Foul-smelling discharge       Post Anesthesia Discharge Instructions    Although you may be awake and alert in the recovery room, small amounts of anesthetic remain in your system for about 24 hours.  You may feel tired and sleepy during this time.      You are advised to go directly home from the hospital.    Plan to stay at home and rest for the remainder of the day.    It is advisable to have someone with you at home for 24 hours after surgery.    Do not operate a motor vehicle, or any mechanical or electrical equipment for the next 24 hours.      Be careful when you are walking around, you may become dizzy.  The effects of anesthesia and/or medications are still present and drowsiness may occur    Do not consume alcohol, tranquilizers, sleeping medications, or any other non prescribed medication for the remainder of the day.    Diet:  begin with liquids, progress your diet as tolerated.  Nausea and vomiting may occur in the next 24 hours.

## 2019-01-17 NOTE — PACU (Signed)
Husband brought back to bs.

## 2019-01-17 NOTE — H&P (Signed)
ADMISSION HISTORY AND PHYSICAL EXAM    Date Time: 01/17/19 8:34 AM  Patient Name: Jaime Hill  Attending Physician: Buena Irish, MD    Assessment:   Missed abortion    Plan:   Suction d and c    History of Present Illness:   Jaime Hill is Hill 28 y.o. female who presents to the hospital with Hill non viable pregnancy diagnosed in the office on serial sono.  Pt desires surgical management    Past Medical History:   History reviewed. No pertinent past medical history.    Past Surgical History:     Past Surgical History:   Procedure Laterality Date    OTHER SURGICAL HISTORY  2002    "I HAD DECAYED TISSUE REMOVED FROM MY CHEST"  UNDER RIGHT COLLARBONE       Family History:   History reviewed. No pertinent family history.    Social History:     Social History     Socioeconomic History    Marital status: Married     Spouse name: Not on file    Number of children: Not on file    Years of education: Not on file    Highest education level: Not on file   Occupational History    Not on file   Social Needs    Financial resource strain: Not on file    Food insecurity:     Worry: Not on file     Inability: Not on file    Transportation needs:     Medical: Not on file     Non-medical: Not on file   Tobacco Use    Smoking status: Never Smoker    Smokeless tobacco: Never Used   Substance and Sexual Activity    Alcohol use: Not on file    Drug use: Not on file    Sexual activity: Not on file   Lifestyle    Physical activity:     Days per week: Not on file     Minutes per session: Not on file    Stress: Not on file   Relationships    Social connections:     Talks on phone: Not on file     Gets together: Not on file     Attends religious service: Not on file     Active member of club or organization: Not on file     Attends meetings of clubs or organizations: Not on file     Relationship status: Not on file    Intimate partner violence:     Fear of current or ex partner: Not on file     Emotionally  abused: Not on file     Physically abused: Not on file     Forced sexual activity: Not on file   Other Topics Concern    Not on file   Social History Narrative    Not on file       Allergies:     Allergies   Allergen Reactions    Nsaids Anaphylaxis       Medications:     Medications Prior to Admission   Medication Sig    Multiple Vitamins-Minerals (MULTIVITAMIN WITH MINERALS) tablet Take 1 tablet by mouth daily       Review of Systems:   Hill comprehensive review of systems was: negative    Physical Exam:     Vitals:    01/17/19 0815   BP: 122/60   Pulse: 87  Resp: 18   Temp: 99.3 F (37.4 C)   SpO2: 100%       Intake and Output Summary (Last 24 hours) at Date Time  No intake or output data in the 24 hours ending 01/17/19 0834    General appearance - alert, well appearing, and in no distress  Chest - clear to auscultation, no wheezes, rales or rhonchi, symmetric air entry  Heart - normal rate, regular rhythm, normal S1, S2, no murmurs, rubs, clicks or gallops  Abdomen - soft, nontender, nondistended, no masses or organomegaly  Extremities - peripheral pulses normal, no pedal edema, no clubbing or cyanosis    Labs:     Results     Procedure Component Value Units Date/Time    COVID-19 (SARS-CoV-2) Verne Carrow Rapid) [[id number]] Collected:  01/17/19 0751    Specimen:  Nasopharyngeal Swab Updated:  01/17/19 0817     Purpose of COVID testing Screening     SARS-CoV-2 Specimen Source Nasopharyngeal     SARS CoV 2 Overall Result Negative    Narrative:       o Collect and clearly label specimen type:  o Upper respiratory specimen: One Nasopharyngeal Dry Swab NO  Transport Media.  o Hand deliver to laboratory ASAP              Rads:   Radiological Procedure reviewed.    Signed by: Buena Irish

## 2019-01-17 NOTE — PACU (Signed)
VSS.  Awake and alert.  Tolerating clears.  Peripad in place.  States readiness for discharge.

## 2019-01-17 NOTE — Transfer of Care (Signed)
Anesthesia Transfer of Care Note    Patient: Jaime Hill    Procedures performed: Procedure(s):  D & C, SUCTION    Anesthesia type: General LMA    Patient location:Phase I PACU    Last vitals:   Vitals:    01/17/19 0923   BP: 119/69   Pulse: 77   Resp: 18   Temp: 37.5 C (99.5 F)   SpO2: 97%       Post pain: Patient not complaining of pain, continue current therapy      Mental Status:awake and alert     Respiratory Function: tolerating nasal cannula    Cardiovascular: stable    Nausea/Vomiting: patient not complaining of nausea or vomiting    Hydration Status: adequate    Post assessment: no apparent anesthetic complications    Signed by: Oley Balm  01/17/19 9:23 AM

## 2019-01-17 NOTE — Anesthesia Postprocedure Evaluation (Addendum)
Anesthesia Post Evaluation    Patient: Jaime Hill    Procedure(s):  D & C, SUCTION    Anesthesia type: general    Last Vitals:   Vitals Value Taken Time   BP 119/69 01/17/2019  9:23 AM   Temp 37.5 C (99.5 F) 01/17/2019  9:23 AM   Pulse 77 01/17/2019  9:23 AM   Resp 18 01/17/2019  9:23 AM   SpO2 97 % 01/17/2019  9:23 AM       Anesthesia Post Evaluation:     Patient Evaluated: PACU  Patient Participation: complete - patient participated  Level of Consciousness: awake  Pain Score: 0  Pain Management: adequate    Airway Patency: patent    Anesthetic complications: No      PONV Status: none    Cardiovascular status: acceptable  Respiratory status: acceptable  Hydration status: acceptable        Anesthesia Qualified Clinical Data Registry 2018    PACU Reintubation  Did the Patient have general anesthesia with intubation: No        PONV Adult  Is the patient aged 28 or older: Yes  Did the patient receive recieve a general anesthestic: Yes  Does the patient have 3 or more risk factors for PONV? Yes  Did the patient receive anti-emetics from at least two classes of medications? Yes      PONV Pediatric  Is the patient aged 25-17? No            PACU Transfer Checklist Protocol  Was the patient transferred to the PACU at the conclusion of surgery? Yes  Was a checklist or transfer protocol used? Yes    ICU Transfer Checklist Protocol  Was the patient transferred to the ICU at the conclusion of surgery? No      Post-op Pain Assessment Prior to Anesthesia Care End  Age >=18 and assessed for pain in PACU: Yes  Pacu pain score <7/10: Yes      Perioperative Mortality  Perioperative mortality prior to Anesthesia end time: No    Perioperative Cardiac Arrest  Did the patient have an unanticipated intraoperative cardiac arrest between anesthesia start time and anesthesia end time? No    Unplanned Admission to ICU  Did the patient have an unplanned admission to the ICU (not initially anticipated at anesthesia start time)?  No      Signed by: Oley Balm, 01/17/2019 9:24 AM

## 2019-01-17 NOTE — Discharge Instructions (Signed)
Post Anesthesia Discharge Instructions    Use tylenol as needed for cramping.    Although you may be awake and alert in the recovery room, small amounts of anesthetic remain in your system for about 24 hours.  You may feel tired and sleepy during this time.      You are advised to go directly home from the hospital.    Plan to stay at home and rest for the remainder of the day.    It is advisable to have someone with you at home for 24 hours after surgery.    Do not operate a motor vehicle, or any mechanical or electrical equipment for the next 24 hours.      Be careful when you are walking around, you may become dizzy.  The effects of anesthesia and/or medications are still present and drowsiness may occur    Do not consume alcohol, tranquilizers, sleeping medications, or any other non prescribed medication for the remainder of the day.    Diet:  begin with liquids, progress your diet as tolerated or as directed by your surgeon.  Nausea and vomiting may occur in the next 24 hours.

## 2019-01-18 ENCOUNTER — Encounter: Payer: Self-pay | Admitting: Obstetrics & Gynecology

## 2019-01-18 ENCOUNTER — Telehealth (INDEPENDENT_AMBULATORY_CARE_PROVIDER_SITE_OTHER): Payer: Self-pay

## 2019-01-18 LAB — CORONAVIRUS, COVID-19: SARS CoV 2 Overall Result: NOT DETECTED

## 2019-01-18 NOTE — Telephone Encounter (Signed)
COVID-19 Test Results Notification Team    Placed call to patient in an effort to provide COVID-19 NEGATIVE test result.  Attempted to reach patient at the following numbers:   [phone]  Left the following voicemail for patient:    My name is Pallavi Clifton  and I am calling from [facility] to provide you with results from your recent test done at [facility] urgent care Please call me back at  Sunday - Thursday between the hours of 8:30 AM - 5 PM or  the COVID Results Call Center at [phone] so that we can provide you with the results of your test.  The Call Center is open 7 days a week, between the hours of 8:00am--8:00pm.  In addition, you can view your results by signing up for MyChart at [email or web address]  Thank you.    Routed results to PCP  Myra Rude, CSAC  COVID-19 Notification Team  Direct Line:  [phone]  COVID-19 Test Results Call Center:  [phone]

## 2019-12-21 ENCOUNTER — Encounter (HOSPITAL_BASED_OUTPATIENT_CLINIC_OR_DEPARTMENT_OTHER): Payer: Self-pay

## 2019-12-21 NOTE — Progress Notes (Signed)
PCCM returned Pt's phone call regarding [facility] services. Pt reports she is seeking an ADHD psychiatric evaluation. PCCM informed Pt that [facility] does not offer ADD/ADHD evaluations. PCCM provided Pt with a few ADD/ADHD testing providers within the community. PCCM informed Pt that Pt is for responsible for verifying rates, in-network insurances, and all financial concerns. Pt reports clear and verbal understanding.PCCM encouraged Pt to contact [facility] with any further questions/concerns. Pt agrees.            +ADHD Testing Providers     Counseling [facility] Forensic Services  [email or web address]  [address]  [place] [address]  [phone]        [facility] Wellness  [email or web address]  [address]   [place], [place] [address]  [phone]) [phone]        [facility] Address: [address] [place], [place] [address]  Phone: [phone]   [facility] [address]  [place] [address]  Phone: [phone]  Fax: [phone]         Psychological Testing [address] [facility]    [place] and [place] locations.    [phone]   [email or web address]   [facility]    Court and ADHD evaluations    Address: [address] [place], [place] [address]  Phone: [phone]  [email or web address]     Neuropsych testing in [place]   (ADHD testing)   [facility]    Address: [address], [place], [place] [address]  Phone: [phone]  [email or web address]  call for insurance/payment information       [facility]  (dyslexia, ADHD, and learning disabilities) Address: [address], [place], [place] [address]  Phone: [phone]  Appointments: [email or web address]   [facility]  ADHD, Gifted Students, Learning Disorders or Learning Disability, Intelligence, Achievement,  Neurological conditions (e.g., dementia), School Admission testing for primary, secondary, and post-secondary education, Personality Disorders, Anxiety, Depression, Complex or treatment resistant psychiatric conditions   Address: [address] [place], [place] [address]  Phone: [phone]  Appointments: [email or web address]   [facility]  Med Management, Therapy, Group therapies, OCD, DBT, CBT, Eating disorders, Nutritional therapy, Gender affirming Address: [address], [place], [place] [address]  Phone: [phone]  Appointments: [email or web address]       [facility]   Address: [address], [place], [place] [address]  Phone: [phone]             Donivan Scull, [facility]    Behavioral Health Care Navigator

## 2020-06-02 ENCOUNTER — Ambulatory Visit (INDEPENDENT_AMBULATORY_CARE_PROVIDER_SITE_OTHER): Payer: BC Managed Care – PPO | Admitting: Medical-Surgical

## 2020-06-13 ENCOUNTER — Ambulatory Visit (INDEPENDENT_AMBULATORY_CARE_PROVIDER_SITE_OTHER): Payer: BC Managed Care – PPO | Admitting: Family Medicine

## 2020-11-13 ENCOUNTER — Encounter (HOSPITAL_BASED_OUTPATIENT_CLINIC_OR_DEPARTMENT_OTHER): Payer: Self-pay

## 2021-03-26 ENCOUNTER — Telehealth: Payer: Self-pay

## 2021-03-30 NOTE — Progress Notes (Unsigned)
Ulbrely denied. Sent to Dr. Everlena Cooper for advise.

## 2021-03-30 NOTE — Telephone Encounter (Signed)
Close encounter
